# Patient Record
Sex: Male | Born: 1975
Health system: Southern US, Community
[De-identification: ages and names within clinical notes are randomized; demographics above are authoritative.]

## PROBLEM LIST (undated history)

## (undated) DIAGNOSIS — E119 Type 2 diabetes mellitus without complications: Secondary | ICD-10-CM

---

## 2016-04-30 ENCOUNTER — Ambulatory Visit: Payer: Self-pay

## 2017-07-13 ENCOUNTER — Ambulatory Visit: Payer: Commercial Managed Care - PPO | Admitting: Family Medicine

## 2017-07-13 DIAGNOSIS — Z0289 Encounter for other administrative examinations: Secondary | ICD-10-CM

## 2017-07-16 ENCOUNTER — Ambulatory Visit: Payer: Commercial Managed Care - PPO | Admitting: Family Medicine

## 2017-08-20 ENCOUNTER — Ambulatory Visit: Payer: Commercial Managed Care - PPO | Admitting: Family Medicine

## 2017-08-25 ENCOUNTER — Ambulatory Visit: Payer: Commercial Managed Care - PPO | Admitting: Family Medicine

## 2019-01-03 ENCOUNTER — Other Ambulatory Visit: Payer: Self-pay

## 2019-01-03 ENCOUNTER — Other Ambulatory Visit: Payer: Self-pay | Admitting: Family Medicine

## 2019-01-03 DIAGNOSIS — Z20822 Contact with and (suspected) exposure to covid-19: Secondary | ICD-10-CM

## 2019-01-04 LAB — NOVEL CORONAVIRUS, NAA: SARS-CoV-2, NAA: NOT DETECTED

## 2019-01-26 ENCOUNTER — Ambulatory Visit
Admission: EM | Admit: 2019-01-26 | Discharge: 2019-01-26 | Disposition: A | Discharge status: Home / Self Care | Payer: Commercial Managed Care - PPO | Attending: Physician Assistant | Admitting: Physician Assistant

## 2019-01-26 DIAGNOSIS — M545 Low back pain, unspecified: Secondary | ICD-10-CM

## 2019-01-26 HISTORY — DX: Type 2 diabetes mellitus without complications: E11.9

## 2019-01-26 MED ORDER — MELOXICAM 7.5 MG PO TABS: 7.5000 mg | ORAL_TABLET | Freq: Every day | ORAL | 0 refills | Status: DC

## 2019-01-26 MED ORDER — METHOCARBAMOL 500 MG PO TABS: 500.0000 mg | ORAL_TABLET | Freq: Two times a day (BID) | ORAL | 0 refills | Status: DC | PRN

## 2019-01-26 NOTE — Discharge Instructions (Signed)
Start Mobic. Do not take ibuprofen (motrin/advil)/ naproxen (aleve) while on mobic. Robaxin as needed, this can make you drowsy, so do not take if you are going to drive, operate heavy machinery, or make important decisions. Ice/heat compresses as needed. Follow up with PCP for further evaluation and management if symptoms continues. If experience numbness/tingling of the inner thighs, loss of bladder or bowel control, go to the emergency department for evaluation.

## 2019-01-26 NOTE — ED Triage Notes (Signed)
Pt c/o lower back pain radiating down both legs x2 days. Took OTC with no relief

## 2019-01-26 NOTE — ED Provider Notes (Signed)
[name]    CSN: [id number] Arrival date & time: 01/26/19  1458      History   Chief Complaint Chief Complaint  Patient presents with  . Back Pain    HPI Dakota Harrison is a 43 y.o. male.   43 year old male comes in for 2-day history of low back pain that radiates down bilateral legs.  Patient denies any obvious injury/trauma.  States work requires long hours of walking, heavy lifting, and occasional strenuous activity.  He has been working longer stretches due to short staffing.  He denies saddle anesthesia, loss of bladder or bowel control.  Denies leg giving out.  Has been taking ibuprofen without much relief.     Past Medical History:  Diagnosis Date  . Diabetes mellitus without complication (HCC)     There are no active problems to display for this patient.   History reviewed. No pertinent surgical history.     Home Medications    Prior to Admission medications   Medication Sig Start Date End Date Taking? Authorizing Provider  meloxicam (MOBIC) 7.5 MG tablet Take 1 tablet (7.5 mg total) by mouth daily. 01/26/19   Cathie Hoops, Yoona Ishii V, PA-C  methocarbamol (ROBAXIN) 500 MG tablet Take 1 tablet (500 mg total) by mouth 2 (two) times daily as needed for muscle spasms. 01/26/19   Belinda Fisher, PA-C    Family History No family history on file.  Social History Social History   Tobacco Use  . Smoking status: Never Smoker  . Smokeless tobacco: Never Used  Substance Use Topics  . Alcohol use: Not Currently  . Drug use: Not on file     Allergies   Patient has no known allergies.   Review of Systems Review of Systems  Reason unable to perform ROS: See HPI as above.     Physical Exam Triage Vital Signs ED Triage Vitals  Enc Vitals Group     BP 01/26/19 1504 (!) 142/83     Pulse Rate 01/26/19 1504 94     Resp 01/26/19 1504 16     Temp 01/26/19 1504 98 F (36.7 C)     Temp Source 01/26/19 1504 Oral     SpO2 01/26/19 1504 94 %     Weight --    Height --      Head Circumference --      Peak Flow --      Pain Score 01/26/19 1459 7     Pain Loc --      Pain Edu? --      Excl. in GC? --    No data found.  Updated Vital Signs BP (!) 142/83 (BP Location: Left Arm)   Pulse 94   Temp 98 F (36.7 C) (Oral)   Resp 16   SpO2 94%   Visual Acuity Right Eye Distance:   Left Eye Distance:   Bilateral Distance:    Right Eye Near:   Left Eye Near:    Bilateral Near:     Physical Exam Constitutional:      General: He is not in acute distress.    Appearance: He is well-developed. He is not diaphoretic.  HENT:     Head: Normocephalic and atraumatic.  Eyes:     Conjunctiva/sclera: Conjunctivae normal.     Pupils: Pupils are equal, round, and reactive to light.  Cardiovascular:     Rate and Rhythm: Normal rate and regular rhythm.     Heart sounds: Normal heart sounds. No murmur.  No friction rub. No gallop.   Pulmonary:     Effort: Pulmonary effort is normal. No accessory muscle usage or respiratory distress.     Breath sounds: Normal breath sounds. No stridor. No decreased breath sounds, wheezing, rhonchi or rales.  Musculoskeletal:     Comments: No tenderness on palpation of the spinous processes.  Tenderness to palpation of bilateral lumbar region.  Decreased flexion and rotation of back due to pain.  Full range of motion of hips. Sensation intact and equal bilaterally.  Negative straight leg raise.   Skin:    General: Skin is warm and dry.  Neurological:     Mental Status: He is alert and oriented to person, place, and time.     UC Treatments / Results  Labs (all labs ordered are listed, but only abnormal results are displayed) Labs Reviewed - No data to display  EKG   Radiology No results found.  Procedures Procedures (including critical care time)  Medications Ordered in UC Medications - No data to display  Initial Impression / Assessment and Plan / UC Course  I have reviewed the triage vital signs and  the nursing notes.  Pertinent labs & imaging results that were available during my care of the patient were reviewed by me and considered in my medical decision making (see chart for details).    Start NSAID as directed for pain and inflammation. Muscle relaxant as needed. Ice/heat compresses. Discussed with patient strain can take up to 3-4 weeks to resolve, but should be getting better each week. Return precautions given.   Final Clinical Impressions(s) / UC Diagnoses   Final diagnoses:  Acute bilateral low back pain without sciatica    ED Prescriptions    Medication Sig Dispense Auth. Provider   meloxicam (MOBIC) 7.5 MG tablet Take 1 tablet (7.5 mg total) by mouth daily. 14 tablet [name] V, PA-C   methocarbamol (ROBAXIN) 500 MG tablet Take 1 tablet (500 mg total) by mouth 2 (two) times daily as needed for muscle spasms. 20 tablet Ok Edwards, PA-C     PDMP not reviewed this encounter.   Ok Edwards, PA-C 01/26/19 1626

## 2019-02-20 ENCOUNTER — Encounter: Payer: Self-pay | Admitting: Emergency Medicine

## 2019-02-20 ENCOUNTER — Other Ambulatory Visit: Payer: Self-pay

## 2019-02-20 ENCOUNTER — Ambulatory Visit
Admission: EM | Admit: 2019-02-20 | Discharge: 2019-02-20 | Disposition: A | Discharge status: Home / Self Care | Payer: Commercial Managed Care - PPO | Attending: Physician Assistant | Admitting: Physician Assistant

## 2019-02-20 DIAGNOSIS — Z20828 Contact with and (suspected) exposure to other viral communicable diseases: Secondary | ICD-10-CM

## 2019-02-20 DIAGNOSIS — R05 Cough: Secondary | ICD-10-CM

## 2019-02-20 DIAGNOSIS — R059 Cough, unspecified: Secondary | ICD-10-CM

## 2019-02-20 MED ORDER — FLUTICASONE PROPIONATE 50 MCG/ACT NA SUSP: 2.0000 | Freq: Every day | NASAL | 0 refills | Status: DC

## 2019-02-20 MED ORDER — TIZANIDINE HCL 4 MG PO TABS: 4.0000 mg | ORAL_TABLET | Freq: Three times a day (TID) | ORAL | 0 refills | Status: DC | PRN

## 2019-02-20 MED ORDER — BENZONATATE 200 MG PO CAPS: 200.0000 mg | ORAL_CAPSULE | Freq: Three times a day (TID) | ORAL | 0 refills | Status: DC

## 2019-02-20 NOTE — Discharge Instructions (Addendum)
As discussed, cannot rule out COVID. Currently, no alarming signs. Testing ordered. I would like you to quarantine until testing results. Start tessalon for cough. Flonase for nasal congestion. If experiencing shortness of breath, trouble breathing, go to the emergency department for further evaluation needed.   Stop robaxin, try tizanidine for back pain. Follow up with sports medicine/orthopedics for further evaluation needed.

## 2019-02-20 NOTE — ED Triage Notes (Signed)
Pt presents to [facility] for 2 days of cough and nasal congestion.,  Denies fevers or chills at home.  Denies n/v/d.

## 2019-02-20 NOTE — ED Provider Notes (Signed)
[name]    CSN: [id number] Arrival date & time: 02/20/19  1110      History   Chief Complaint Chief Complaint  Patient presents with  . Cough    HPI Dakota Harrison is a 43 y.o. male.   43 year old male comes in for 2-day history of URI symptoms.  He has had nonproductive cough, nasal congestion.  Denies rhinorrhea, sore throat.  Denies fever, chills, body aches.  Denies shortness of breath.  He does feel that he is has decreased taste.  Denies abdominal pain, nausea, vomiting, diarrhea.  Denies known exposure.  Never smoker.     Past Medical History:  Diagnosis Date  . Diabetes mellitus without complication (HCC)     There are no active problems to display for this patient.   History reviewed. No pertinent surgical history.     Home Medications    Prior to Admission medications   Medication Sig Start Date End Date Taking? Authorizing Provider  benzonatate (TESSALON) 200 MG capsule Take 1 capsule (200 mg total) by mouth every 8 (eight) hours. 02/20/19   Cathie Hoops, Amy V, PA-C  fluticasone (FLONASE) 50 MCG/ACT nasal spray Place 2 sprays into both nostrils daily. 02/20/19   Cathie Hoops, Amy V, PA-C  methocarbamol (ROBAXIN) 500 MG tablet Take 1 tablet (500 mg total) by mouth 2 (two) times daily as needed for muscle spasms. 01/26/19   Cathie Hoops, Amy V, PA-C  tiZANidine (ZANAFLEX) 4 MG tablet Take 1 tablet (4 mg total) by mouth every 8 (eight) hours as needed for muscle spasms. 02/20/19   Belinda Fisher, PA-C    Family History History reviewed. No pertinent family history.  Social History Social History   Tobacco Use  . Smoking status: Never Smoker  . Smokeless tobacco: Never Used  Substance Use Topics  . Alcohol use: Not Currently  . Drug use: Not on file     Allergies   Patient has no known allergies.   Review of Systems Review of Systems  Reason unable to perform ROS: See HPI as above.     Physical Exam Triage Vital Signs ED Triage Vitals  Enc Vitals Group   BP 02/20/19 1123 139/86     Pulse Rate 02/20/19 1123 70     Resp 02/20/19 1123 18     Temp 02/20/19 1123 98.7 F (37.1 C)     Temp Source 02/20/19 1123 Temporal     SpO2 02/20/19 1123 97 %     Weight --      Height --      Head Circumference --      Peak Flow --      Pain Score 02/20/19 1124 0     Pain Loc --      Pain Edu? --      Excl. in GC? --    No data found.  Updated Vital Signs BP 139/86 (BP Location: Left Arm)   Pulse 70   Temp 98.7 F (37.1 C) (Temporal)   Resp 18   SpO2 97%   Physical Exam Constitutional:      General: He is not in acute distress.    Appearance: Normal appearance. He is not ill-appearing, toxic-appearing or diaphoretic.  HENT:     Head: Normocephalic and atraumatic.     Mouth/Throat:     Mouth: Mucous membranes are moist.     Pharynx: Oropharynx is clear. Uvula midline.  Neck:     Musculoskeletal: Normal range of motion and neck supple.  Cardiovascular:  Rate and Rhythm: Normal rate and regular rhythm.     Heart sounds: Normal heart sounds. No murmur. No friction rub. No gallop.   Pulmonary:     Effort: Pulmonary effort is normal. No accessory muscle usage, prolonged expiration, respiratory distress or retractions.     Comments: Lungs clear to auscultation without adventitious lung sounds. Skin:    General: Skin is warm and dry.  Neurological:     General: No focal deficit present.     Mental Status: He is alert and oriented to person, place, and time.      UC Treatments / Results  Labs (all labs ordered are listed, but only abnormal results are displayed) Labs Reviewed  NOVEL CORONAVIRUS, NAA    EKG   Radiology No results found.  Procedures Procedures (including critical care time)  Medications Ordered in UC Medications - No data to display  Initial Impression / Assessment and Plan / UC Course  I have reviewed the triage vital signs and the nursing notes.  Pertinent labs & imaging results that were available  during my care of the patient were reviewed by me and considered in my medical decision making (see chart for details).    No alarming signs on exam.  Patient speaking in full sentences without respiratory distress.  COVID testing ordered.  Patient to quarantine until testing results return.  Symptomatic treatment discussed.  Push fluids.  Return precautions given.  Patient expresses understanding and agrees to plan.  Patient was seen 01/26/2019 for back pain.  States medications has helped, but has not completely resolved pain.  He has been working to get in with sports medicine, but has long wait.  States Robaxin makes him drowsy, and therefore only takes at night, but does help symptoms.  At this time, will have patient discontinue Robaxin and try tizanidine.  Will provide other resources for orthopedics for further evaluation and management needed.  Final Clinical Impressions(s) / UC Diagnoses   Final diagnoses:  Cough   ED Prescriptions    Medication Sig Dispense Auth. Provider   benzonatate (TESSALON) 200 MG capsule Take 1 capsule (200 mg total) by mouth every 8 (eight) hours. 21 capsule Yu, Amy V, PA-C   tiZANidine (ZANAFLEX) 4 MG tablet Take 1 tablet (4 mg total) by mouth every 8 (eight) hours as needed for muscle spasms. 30 tablet Yu, Amy V, PA-C   fluticasone (FLONASE) 50 MCG/ACT nasal spray Place 2 sprays into both nostrils daily. 1 g Ok Edwards, PA-C     PDMP not reviewed this encounter.   Ok Edwards, PA-C 02/20/19 1233

## 2019-02-21 ENCOUNTER — Telehealth: Payer: Self-pay | Admitting: Emergency Medicine

## 2019-02-21 NOTE — Telephone Encounter (Signed)
Left voicemail checking in on patient, and encouraged return call with any continuing questions or concerns.    

## 2019-02-22 LAB — NOVEL CORONAVIRUS, NAA: SARS-CoV-2, NAA: NOT DETECTED

## 2019-02-23 ENCOUNTER — Telehealth: Payer: Self-pay | Admitting: General Practice

## 2019-02-23 NOTE — Telephone Encounter (Signed)
Pt called to get COVID results, made him aware they are negative. 

## 2019-02-25 ENCOUNTER — Ambulatory Visit (INDEPENDENT_AMBULATORY_CARE_PROVIDER_SITE_OTHER): Payer: Commercial Managed Care - PPO

## 2019-02-25 ENCOUNTER — Other Ambulatory Visit (HOSPITAL_COMMUNITY)
Admission: RE | Admit: 2019-02-25 | Discharge: 2019-02-25 | Disposition: A | Discharge status: Home / Self Care | Payer: Commercial Managed Care - PPO | Source: Ambulatory Visit | Attending: Family Medicine | Admitting: Family Medicine

## 2019-02-25 ENCOUNTER — Other Ambulatory Visit: Payer: Self-pay

## 2019-02-25 ENCOUNTER — Encounter: Payer: Self-pay | Admitting: Family Medicine

## 2019-02-25 ENCOUNTER — Ambulatory Visit (INDEPENDENT_AMBULATORY_CARE_PROVIDER_SITE_OTHER): Payer: Commercial Managed Care - PPO | Admitting: Family Medicine

## 2019-02-25 VITALS — BP 120/70 | HR 78 | Ht 72.0 in | Wt 242.0 lb

## 2019-02-25 DIAGNOSIS — S39012A Strain of muscle, fascia and tendon of lower back, initial encounter: Secondary | ICD-10-CM

## 2019-02-25 DIAGNOSIS — R351 Nocturia: Secondary | ICD-10-CM

## 2019-02-25 DIAGNOSIS — R350 Frequency of micturition: Secondary | ICD-10-CM

## 2019-02-25 LAB — URINALYSIS, ROUTINE W REFLEX MICROSCOPIC
Bilirubin Urine: NEGATIVE
Hgb urine dipstick: NEGATIVE
Ketones, ur: NEGATIVE
Leukocytes,Ua: NEGATIVE
Nitrite: NEGATIVE
RBC / HPF: NONE SEEN (ref 0–?)
Specific Gravity, Urine: 1.01 (ref 1.000–1.030)
Total Protein, Urine: NEGATIVE
Urine Glucose: >=1000 — AB
Urobilinogen, UA: 0.2 (ref 0.0–1.0)
pH: 7.5 (ref 5.0–8.0)

## 2019-02-25 MED ORDER — MELOXICAM 15 MG PO TABS: 15.0000 mg | ORAL_TABLET | Freq: Every day | ORAL | 0 refills | Status: DC

## 2019-02-25 NOTE — Patient Instructions (Signed)
Back Injury Prevention Back injuries can be very painful. They can also be difficult to heal. After having one back injury, you are more likely to have another one again. It is important to learn how to avoid injuring or re-injuring your back. The following tips can help you to prevent a back injury. What actions can I take to prevent back injuries? Changes in your diet Talk with your doctor about what to eat. Some foods can make the bones strong.  Talk with your doctor about how much calcium and vitamin D you need each day. These nutrients help to prevent weakening of the bones (osteoporosis).  Eat foods that have calcium. These include: ? Dairy products. ? Green leafy vegetables. ? Food and drinks that have had calcium added to them (fortified).  Eat foods that have vitamin D. These include: ? Milk. ? Food and drinks that have had vitamin D added to them.  Take other supplements and vitamins only as told by your doctor. Physical fitness Physical fitness makes your bones and muscles strong. It also improves your balance and strength.  Exercise for 30 minutes per day on most days of the week, or as told by your doctor. Make sure to: ? Do aerobic exercises, such as walking, jogging, biking, or swimming. ? Do exercises that increase balance and strength, such as tai chi and yoga. ? Do stretching exercises. This helps with flexibility. ? Develop strong belly (abdominal) muscles. Your belly muscles help to support your back.  Stay at a healthy weight. This lowers your risk of a back injury. Good posture        Prevent back injuries by developing and maintaining a good posture. To do this:  Sit up straight and stand up straight. Avoid leaning forward when you sit or hunching over when you stand.  Choose chairs that have good low-back (lumbar) support.  If you work at a desk: ? Sit close to it so you do not need to lean over. ? Keep your chin tucked in. ? Keep your neck drawn  back. ? Keep your elbows bent so that your arms make a corner (right angle).  When you drive: ? Sit high and close to the steering wheel. Add a low-back support to your car seat, if needed. ? Take breaks every hour if you are driving for long periods of time.  Avoid sitting or standing in one position for very long. Take breaks to get up, stretch, and walk around at least once every hour.  Sleep on your side with your knees slightly bent, or sleep on your back with a pillow under your knees.  Lifting, twisting, and reaching   Heavy lifting ? Avoid heavy lifting, especially lifting over and over again. If you must do heavy lifting: ? Stretch before lifting. ? Work slowly. ? Rest between lifts. ? Use a tool such as a cart or a dolly to move objects if one is available. ? Make several small trips instead of carrying one heavy load. ? Ask for help when you need it, especially when moving big objects. ? Follow these steps when lifting: ? Stand with your feet shoulder-width apart. ? Get as close to the object as you can. Do not pick up a heavy object that is far from your body. ? Use handles or lifting straps if they are available. ? Bend at your knees. Squat down, but keep your heels off the floor. ? Keep your shoulders back. Keep your chin tucked in. Keep  your back straight. ? Lift the object slowly while you tighten the muscles in your legs, belly, and bottom. Keep the object as close to the center of your body as possible. ? Follow these steps when putting down a heavy load: ? Stand with your feet shoulder-width apart. ? Lower the object slowly while you tighten the muscles in your legs, belly, and bottom. Keep the object as close to the center of your body as possible. ? Keep your shoulders back. Keep your chin tucked in. Keep your back straight. ? Bend at your knees. Squat down, but keep your heels off the floor. ? Use handles or lifting straps if they are available.  Twisting and  reaching ? Avoid lifting heavy objects above your waist. ? Do not twist at your waist while you are lifting or carrying a load. If you need to turn, move your feet. ? Do not bend over without bending at your knees. ? Avoid reaching over your head, across a table, or for an object on a high surface. Other things to do   Avoid wet floors and icy ground. Keep sidewalks clear of ice to prevent falls.  Do not sleep on a mattress that is too soft or too hard.  Store heavier objects on shelves at waist level.  Store lighter objects on lower or higher shelves.  Find ways to lower your stress, such as: ? Exercise. ? Massage. ? Relaxation techniques.  Talk with your doctor if you feel anxious or depressed. These conditions can make back pain worse.  Wear flat heel shoes with cushioned soles.  Use both shoulder straps when carrying a backpack.  Do not use any products that contain nicotine or tobacco, such as cigarettes and e-cigarettes. If you need help quitting, ask your doctor. Summary  Back injuries can be very painful and difficult to heal.  You can keep your back healthy by making certain changes. These include eating foods that make bones strong, working on being physically fit, developing a good posture, and lifting heavy objects in a safe way. This information is not intended to replace advice given to you by your health care provider. Make sure you discuss any questions you have with your health care provider. Document Released: 09/24/2007 Document Revised: 05/29/2017 Document Reviewed: 05/29/2017 Elsevier Patient Education  2020 [employer].
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
## 2019-02-27 LAB — URINE CULTURE
MICRO NUMBER:: 1073326
Result:: NO GROWTH
SPECIMEN QUALITY:: ADEQUATE

## 2019-03-01 LAB — URINE CYTOLOGY ANCILLARY ONLY
Chlamydia: NEGATIVE
Comment: NEGATIVE
Comment: NEGATIVE
Comment: NORMAL
Neisseria Gonorrhea: NEGATIVE
Trichomonas: NEGATIVE

## 2019-03-18 ENCOUNTER — Encounter: Payer: Self-pay | Admitting: Emergency Medicine

## 2019-03-18 ENCOUNTER — Ambulatory Visit
Admission: EM | Admit: 2019-03-18 | Discharge: 2019-03-18 | Disposition: A | Discharge status: Home / Self Care | Payer: Commercial Managed Care - PPO

## 2019-03-18 ENCOUNTER — Other Ambulatory Visit: Payer: Self-pay

## 2019-03-18 DIAGNOSIS — Z20822 Contact with and (suspected) exposure to covid-19: Secondary | ICD-10-CM

## 2019-03-18 DIAGNOSIS — Z20828 Contact with and (suspected) exposure to other viral communicable diseases: Secondary | ICD-10-CM

## 2019-03-18 NOTE — ED Triage Notes (Signed)
Pt presents to [facility] for assessment after being exposed to a COVID positive person Wednesday at work.  When asked about symptoms patient states "I've got a cough, but I've had it for way longer than this".  This RN did not test at this time due to [facility] guidelines for exposure testing, but will have patient speak to the provider.

## 2019-03-18 NOTE — ED Notes (Signed)
Patient able to ambulate independently  

## 2019-03-18 NOTE — ED Provider Notes (Signed)
[name]    CSN: [id number] Arrival date & time: 03/18/19  9323      History   Chief Complaint Chief Complaint  Patient presents with  . COVID Exposure    HPI Dakota Harrison is a 43 y.o. male presenting for  Presenting for Covid testing: Exposure: coworker Date of exposure: Wednesday 11/25 Any fever, symptoms since exposure: no Patient has no history of dry, chronic cough for which he is followed by his PCP.   Past Medical History:  Diagnosis Date  . Diabetes mellitus without complication [facility])     Patient Active Problem List   Diagnosis Date Noted  . Nocturia 02/25/2019  . Urinary frequency 02/25/2019  . Strain of lumbar region 02/25/2019    History reviewed. No pertinent surgical history.     Home Medications    Prior to Admission medications   Medication Sig Start Date End Date Taking? Authorizing Provider  meloxicam (MOBIC) 15 MG tablet Take 1 tablet (15 mg total) by mouth daily. 02/25/19   Dakota Maw, MD  fluticasone [facility]) 50 MCG/ACT nasal spray Place 2 sprays into both nostrils daily. 02/20/19 03/18/19  Ok Edwards, PA-C    Family History Family History  Family history unknown: Yes    Social History Social History   Tobacco Use  . Smoking status: Never Smoker  . Smokeless tobacco: Never Used  Substance Use Topics  . Alcohol use: Not Currently  . Drug use: Never     Allergies   Patient has no known allergies.   Review of Systems Review of Systems  Constitutional: Negative for fatigue and fever.  Respiratory: Negative for cough and shortness of breath.   Cardiovascular: Negative for chest pain and palpitations.  Gastrointestinal: Negative for abdominal pain, diarrhea and vomiting.  Musculoskeletal: Negative for arthralgias and myalgias.  Skin: Negative for rash and wound.  Neurological: Negative for speech difficulty and headaches.  All other systems reviewed and are negative.    Physical Exam Triage  Vital Signs ED Triage Vitals  Enc Vitals Group     BP      Pulse      Resp      Temp      Temp src      SpO2      Weight      Height      Head Circumference      Peak Flow      Pain Score      Pain Loc      Pain Edu?      Excl. in [place]?    No data found.  Updated Vital Signs BP 120/78 (BP Location: Left Arm)   Pulse 92   Temp 98.4 F (36.9 C) (Temporal)   Resp 18   SpO2 95%   Visual Acuity Right Eye Distance:   Left Eye Distance:   Bilateral Distance:    Right Eye Near:   Left Eye Near:    Bilateral Near:     Physical Exam Constitutional:      General: He is not in acute distress. HENT:     Head: Normocephalic and atraumatic.  Eyes:     General: No scleral icterus.    Pupils: Pupils are equal, round, and reactive to light.  Cardiovascular:     Rate and Rhythm: Normal rate.  Pulmonary:     Effort: Pulmonary effort is normal. No respiratory distress.     Breath sounds: No wheezing.  Skin:    Coloration:  Skin is not jaundiced or pale.  Neurological:     Mental Status: He is alert and oriented to person, place, and time.      UC Treatments / Results  Labs (all labs ordered are listed, but only abnormal results are displayed) Labs Reviewed - No data to display  EKG   Radiology No results found.  Procedures Procedures (including critical care time)  Medications Ordered in UC Medications - No data to display  Initial Impression / Assessment and Plan / UC Course  I have reviewed the triage vital signs and the nursing notes.  Pertinent labs & imaging results that were available during my care of the patient were reviewed by me and considered in my medical decision making (see chart for details).     Patient does not meet CDC guidelines for Covid PCR testing at this time.  Provided work note stating when he would qualify: No sooner than Monday, 03/21/2019.  Return precautions discussed, patient verbalized understanding and is agreeable to plan.  Final Clinical Impressions(s) / UC Diagnoses   Final diagnoses:  Exposure to COVID-19 virus     Discharge Instructions     Based on exposure, you would qualify for testing on Monday, 11/30.    ED Prescriptions    None     PDMP not reviewed this encounter.   Hall-Potvin, [place], [place] 03/18/19 1032

## 2019-03-18 NOTE — Discharge Instructions (Addendum)
Based on exposure, you would qualify for testing on Monday, 11/30.

## 2019-03-21 ENCOUNTER — Ambulatory Visit
Admission: EM | Admit: 2019-03-21 | Discharge: 2019-03-21 | Disposition: A | Discharge status: Home / Self Care | Payer: Commercial Managed Care - PPO | Attending: Emergency Medicine | Admitting: Emergency Medicine

## 2019-03-21 ENCOUNTER — Other Ambulatory Visit: Payer: Self-pay

## 2019-03-21 ENCOUNTER — Encounter: Payer: Self-pay | Admitting: Emergency Medicine

## 2019-03-21 DIAGNOSIS — Z20828 Contact with and (suspected) exposure to other viral communicable diseases: Secondary | ICD-10-CM | POA: Diagnosis not present

## 2019-03-21 DIAGNOSIS — Z20822 Contact with and (suspected) exposure to covid-19: Secondary | ICD-10-CM

## 2019-03-21 NOTE — ED Triage Notes (Signed)
Pt presents to [facility] 5 days after his last known exposure to a coworker who tested positive for COVID-19.  Denies any symptoms except a cough that has been "going on for months".

## 2019-03-21 NOTE — ED Provider Notes (Signed)
[name]    CSN: [id number] Arrival date & time: 03/21/19  1127      History   Chief Complaint Chief Complaint  Patient presents with  . COVID Exposure    HPI Dakota Harrison is a 43 y.o. male with history of diabetes presenting for Covid testing due to exposure at work.  Work Librarian, academic.  Last exposure was 11/25.  Patient previously evaluated for this on 11/27, though due to being asymptomatic was not within CDC recommended timeframe for testing.  Patient still asymptomatic, afebrile: Has now been 5 days since exposure.  Past Medical History:  Diagnosis Date  . Diabetes mellitus without complication [facility])     Patient Active Problem List   Diagnosis Date Noted  . Nocturia 02/25/2019  . Urinary frequency 02/25/2019  . Strain of lumbar region 02/25/2019    History reviewed. No pertinent surgical history.     Home Medications    Prior to Admission medications   Medication Sig Start Date End Date Taking? Authorizing Provider  meloxicam (MOBIC) 15 MG tablet Take 1 tablet (15 mg total) by mouth daily. 02/25/19   Mliss Sax, MD  fluticasone [facility]) 50 MCG/ACT nasal spray Place 2 sprays into both nostrils daily. 02/20/19 03/18/19  Belinda Fisher, PA-C    Family History Family History  Problem Relation Age of Onset  . Healthy Mother   . Healthy Father     Social History Social History   Tobacco Use  . Smoking status: Never Smoker  . Smokeless tobacco: Never Used  Substance Use Topics  . Alcohol use: Not Currently  . Drug use: Never     Allergies   Patient has no known allergies.   Review of Systems Review of Systems  Constitutional: Negative for fatigue and fever.  Respiratory: Negative for cough and shortness of breath.   Cardiovascular: Negative for chest pain and palpitations.  Gastrointestinal: Negative for abdominal pain, diarrhea and vomiting.  Musculoskeletal: Negative for arthralgias and myalgias.  Skin: Negative for  rash and wound.  Neurological: Negative for speech difficulty and headaches.  All other systems reviewed and are negative.    Physical Exam Triage Vital Signs ED Triage Vitals [03/21/19 1134]  Enc Vitals Group     BP 132/80     Pulse Rate 79     Resp 18     Temp 97.9 F (36.6 C)     Temp Source Temporal     SpO2 94 %     Weight      Height      Head Circumference      Peak Flow      Pain Score 0     Pain Loc      Pain Edu?      Excl. in [place]?    No data found.  Updated Vital Signs BP 132/80 (BP Location: Right Arm)   Pulse 79   Temp 97.9 F (36.6 C) (Temporal)   Resp 18   SpO2 94%   Visual Acuity Right Eye Distance:   Left Eye Distance:   Bilateral Distance:    Right Eye Near:   Left Eye Near:    Bilateral Near:     Physical Exam Constitutional:      General: He is not in acute distress. HENT:     Head: Normocephalic and atraumatic.  Eyes:     General: No scleral icterus.    Pupils: Pupils are equal, round, and reactive to light.  Cardiovascular:  Rate and Rhythm: Normal rate.  Pulmonary:     Effort: Pulmonary effort is normal.  Skin:    Coloration: Skin is not jaundiced or pale.  Neurological:     Mental Status: He is alert and oriented to person, place, and time.      UC Treatments / Results  Labs (all labs ordered are listed, but only abnormal results are displayed) Labs Reviewed  NOVEL CORONAVIRUS, NAA    EKG   Radiology No results found.  Procedures Procedures (including critical care time)  Medications Ordered in UC Medications - No data to display  Initial Impression / Assessment and Plan / UC Course  I have reviewed the triage vital signs and the nursing notes.  Pertinent labs & imaging results that were available during my care of the patient were reviewed by me and considered in my medical decision making (see chart for details).     Covid PCR test pending: Patient to quarantine until results are back.  Return  precautions discussed, patient verbalized understanding and is agreeable to plan. Final Clinical Impressions(s) / UC Diagnoses   Final diagnoses:  Exposure to COVID-19 virus     Discharge Instructions     Your COVID test is pending - it is important to quarantine / isolate at home until your results are back. If you test positive and would like further evaluation for persistent or worsening symptoms, you may schedule an E-visit or virtual (video) visit throughout the [facility] app or website.  PLEASE NOTE: If you develop severe chest pain or shortness of breath please go to the ER or call [phone] for further evaluation --> DO NOT schedule electronic or virtual visits for this. Please call our office for further guidance / recommendations as needed.    ED Prescriptions    None     PDMP not reviewed this encounter.   Hall-Potvin, [place], [place] 03/21/19 1206

## 2019-03-21 NOTE — ED Notes (Signed)
Patient able to ambulate independently  

## 2019-03-21 NOTE — Discharge Instructions (Signed)
Your COVID test is pending - it is important to quarantine / isolate at home until your results are back. °If you test positive and would like further evaluation for persistent or worsening symptoms, you may schedule an E-visit or virtual (video) visit throughout the [facility] MyChart app or website. ° °PLEASE NOTE: If you develop severe chest pain or shortness of breath please go to the ER or call 9-1-1 for further evaluation --> DO NOT schedule electronic or virtual visits for this. °Please call our office for further guidance / recommendations as needed. °

## 2019-03-22 LAB — NOVEL CORONAVIRUS, NAA: SARS-CoV-2, NAA: NOT DETECTED

## 2019-04-27 ENCOUNTER — Other Ambulatory Visit: Payer: Commercial Managed Care - PPO

## 2019-04-27 ENCOUNTER — Other Ambulatory Visit: Payer: Self-pay

## 2019-04-27 ENCOUNTER — Encounter: Payer: Self-pay | Admitting: Emergency Medicine

## 2019-04-27 ENCOUNTER — Ambulatory Visit
Admission: EM | Admit: 2019-04-27 | Discharge: 2019-04-27 | Disposition: A | Discharge status: Home / Self Care | Payer: Commercial Managed Care - PPO | Attending: Emergency Medicine | Admitting: Emergency Medicine

## 2019-04-27 DIAGNOSIS — S39012A Strain of muscle, fascia and tendon of lower back, initial encounter: Secondary | ICD-10-CM

## 2019-04-27 DIAGNOSIS — R05 Cough: Secondary | ICD-10-CM

## 2019-04-27 DIAGNOSIS — Z20822 Contact with and (suspected) exposure to covid-19: Secondary | ICD-10-CM | POA: Diagnosis not present

## 2019-04-27 DIAGNOSIS — R519 Headache, unspecified: Secondary | ICD-10-CM | POA: Diagnosis not present

## 2019-04-27 DIAGNOSIS — R059 Cough, unspecified: Secondary | ICD-10-CM

## 2019-04-27 MED ORDER — MELOXICAM 15 MG PO TABS: 15.0000 mg | ORAL_TABLET | Freq: Every day | ORAL | 0 refills | Status: DC

## 2019-04-27 MED ORDER — KETOROLAC TROMETHAMINE 60 MG/2ML IM SOLN
60.0000 mg | Freq: Once | INTRAMUSCULAR | Status: AC
  Administered 2019-04-27: 14:00:00 60 mg via INTRAMUSCULAR

## 2019-04-27 MED ORDER — DEXAMETHASONE SODIUM PHOSPHATE 10 MG/ML IJ SOLN
10.0000 mg | Freq: Once | INTRAMUSCULAR | Status: AC
  Administered 2019-04-27: 14:00:00 10 mg via INTRAMUSCULAR

## 2019-04-27 MED ORDER — ONDANSETRON HCL 4 MG PO TABS: 4.0000 mg | ORAL_TABLET | Freq: Four times a day (QID) | ORAL | 0 refills | Status: DC

## 2019-04-27 MED ORDER — ONDANSETRON 4 MG PO TBDP
4.0000 mg | ORAL_TABLET | Freq: Once | ORAL | Status: AC
  Administered 2019-04-27: 4 mg via ORAL

## 2019-04-27 NOTE — Discharge Instructions (Signed)
Your COVID test is pending - it is important to quarantine / isolate at home until your results are back. °If you test positive and would like further evaluation for persistent or worsening symptoms, you may schedule an E-visit or virtual (video) visit throughout the [place] MyChart app or website. ° °PLEASE NOTE: If you develop severe chest pain or shortness of breath please go to the ER or call 9-1-1 for further evaluation --> DO NOT schedule electronic or virtual visits for this. °Please call our office for further guidance / recommendations as needed. °

## 2019-04-27 NOTE — ED Provider Notes (Signed)
[name]    CSN: [id number] Arrival date & time: 04/27/19  1205      History   Chief Complaint Chief Complaint  Patient presents with  . Headache  . Emesis    HPI Dakota Harrison is a 44 y.o. male with history of diabetes presenting for generalized headache since 1/2.  States it has been nearly constant, though alleviated by Tylenol.  Patient has had photophobia, nausea with 3 episodes of emesis.  No bile or blood.  Last emesis was last night.  Able to keep down food and water without nausea today.  Patient denies worst headache of life, thunderclap headache.  Patient states this feels like a migraine, which he endorses having previously.  Denies recent head trauma, change in vision, tinnitus, fever.   Past Medical History:  Diagnosis Date  . Diabetes mellitus without complication [facility])     Patient Active Problem List   Diagnosis Date Noted  . Nocturia 02/25/2019  . Urinary frequency 02/25/2019  . Strain of lumbar region 02/25/2019    History reviewed. No pertinent surgical history.     Home Medications    Prior to Admission medications   Medication Sig Start Date End Date Taking? Authorizing Provider  meloxicam (MOBIC) 15 MG tablet Take 1 tablet (15 mg total) by mouth daily. 04/27/19   Hall-Potvin, [place], PA-C  ondansetron (ZOFRAN) 4 MG tablet Take 1 tablet (4 mg total) by mouth every 6 (six) hours. 04/27/19   Hall-Potvin, [place], PA-C  fluticasone (FLONASE) 50 MCG/ACT nasal spray Place 2 sprays into both nostrils daily. 02/20/19 03/18/19  Belinda Fisher, PA-C    Family History Family History  Problem Relation Age of Onset  . Healthy Mother   . Healthy Father     Social History Social History   Tobacco Use  . Smoking status: Never Smoker  . Smokeless tobacco: Never Used  Substance Use Topics  . Alcohol use: Not Currently  . Drug use: Never     Allergies   Patient has no known allergies.   Review of Systems Review of Systems    Constitutional: Negative for activity change, appetite change, fatigue and fever.  HENT: Negative for congestion, sinus pressure and sinus pain.   Respiratory: Negative for cough, chest tightness, shortness of breath, wheezing and stridor.   Cardiovascular: Negative for chest pain and palpitations.  Gastrointestinal: Positive for nausea. Negative for abdominal pain, diarrhea and vomiting.       Intermittent  Musculoskeletal: Negative for arthralgias and myalgias.  Skin: Negative for rash and wound.  Neurological: Positive for headaches. Negative for dizziness, tremors, facial asymmetry, speech difficulty, weakness, light-headedness and numbness.  All other systems reviewed and are negative.    Physical Exam Triage Vital Signs ED Triage Vitals  Enc Vitals Group     BP      Pulse      Resp      Temp      Temp src      SpO2      Weight      Height      Head Circumference      Peak Flow      Pain Score      Pain Loc      Pain Edu?      Excl. in GC?    No data found.  Updated Vital Signs BP 116/79 (BP Location: Right Arm)   Pulse 95   Temp 98.8 F (37.1 C) (Temporal)   Resp 18  SpO2 94%   Visual Acuity Right Eye Distance:   Left Eye Distance:   Bilateral Distance:    Right Eye Near:   Left Eye Near:    Bilateral Near:     Physical Exam Constitutional:      General: He is not in acute distress.    Appearance: He is not ill-appearing or diaphoretic.  HENT:     Head: Normocephalic and atraumatic.     Right Ear: Tympanic membrane, ear canal and external ear normal.     Left Ear: Tympanic membrane, ear canal and external ear normal.     Mouth/Throat:     Mouth: Mucous membranes are moist.     Pharynx: Oropharynx is clear.  Eyes:     General: No scleral icterus.    Extraocular Movements: Extraocular movements intact.     Conjunctiva/sclera: Conjunctivae normal.     Pupils: Pupils are equal, round, and reactive to light.  Cardiovascular:     Rate and Rhythm:  Normal rate.  Pulmonary:     Effort: Pulmonary effort is normal. No respiratory distress.     Breath sounds: No wheezing.  Musculoskeletal:        General: No deformity. Normal range of motion.     Cervical back: Normal range of motion. No rigidity or tenderness.  Lymphadenopathy:     Cervical: No cervical adenopathy.  Skin:    Capillary Refill: Capillary refill takes less than 2 seconds.     Coloration: Skin is not jaundiced.     Findings: No bruising or rash.  Neurological:     Mental Status: He is alert.     Cranial Nerves: Cranial nerves are intact.     Sensory: Sensation is intact.     Motor: Motor function is intact.     Coordination: Coordination is intact.     Gait: Gait is intact.  Psychiatric:        Mood and Affect: Mood normal.        Behavior: Behavior normal.      UC Treatments / Results  Labs (all labs ordered are listed, but only abnormal results are displayed) Labs Reviewed  NOVEL CORONAVIRUS, NAA    EKG   Radiology No results found.  Procedures Procedures (including critical care time)  Medications Ordered in UC Medications  ketorolac (TORADOL) injection 60 mg (60 mg Intramuscular Given 04/27/19 1337)  dexamethasone (DECADRON) injection 10 mg (10 mg Intramuscular Given 04/27/19 1337)  ondansetron (ZOFRAN-ODT) disintegrating tablet 4 mg (4 mg Oral Given 04/27/19 1338)    Initial Impression / Assessment and Plan / UC Course  I have reviewed the triage vital signs and the nursing notes.  Pertinent labs & imaging results that were available during my care of the patient were reviewed by me and considered in my medical decision making (see chart for details).     Patient afebrile, nontoxic, and without neurocognitive deficit in office.  Given IM Toradol, Decadron, and Zofran ODT with improvement of symptoms upon time of discharge.  Will provide Mobic, Zofran as needed short-term, and have patient follow-up with PCP for further evaluation/management if  needed.  Encourage keeping symptom log in the interim.  Patient requesting Covid testing due to mild intermittent cough: PCR pending.  Return precautions discussed, patient verbalized understanding and is agreeable to plan. Final Clinical Impressions(s) / UC Diagnoses   Final diagnoses:  Cough  Nonintractable headache, unspecified chronicity pattern, unspecified headache type     Discharge Instructions     Your  COVID test is pending - it is important to quarantine / isolate at home until your results are back. If you test positive and would like further evaluation for persistent or worsening symptoms, you may schedule an E-visit or virtual (video) visit throughout the [facility] app or website.  PLEASE NOTE: If you develop severe chest pain or shortness of breath please go to the ER or call [phone] for further evaluation --> DO NOT schedule electronic or virtual visits for this. Please call our office for further guidance / recommendations as needed.    ED Prescriptions    Medication Sig Dispense Auth. Provider   meloxicam (MOBIC) 15 MG tablet Take 1 tablet (15 mg total) by mouth daily. 14 tablet Hall-Potvin, [place], PA-C   ondansetron (ZOFRAN) 4 MG tablet Take 1 tablet (4 mg total) by mouth every 6 (six) hours. 12 tablet Hall-Potvin, [place], PA-C     PDMP not reviewed this encounter.   Hall-Potvin, [place], [place] 04/28/19 1106

## 2019-04-27 NOTE — ED Triage Notes (Signed)
Pt presents to [facility] for assessment of emesis and headache since 1/2.  Patient denies nasal congestion or sore throat.  C/o mild cough.

## 2019-04-28 LAB — NOVEL CORONAVIRUS, NAA: SARS-CoV-2, NAA: NOT DETECTED

## 2019-05-05 ENCOUNTER — Other Ambulatory Visit: Payer: Self-pay

## 2019-05-06 ENCOUNTER — Encounter: Payer: Commercial Managed Care - PPO | Admitting: Family Medicine

## 2019-05-06 ENCOUNTER — Ambulatory Visit: Payer: Commercial Managed Care - PPO | Admitting: Family Medicine

## 2019-05-13 ENCOUNTER — Ambulatory Visit (INDEPENDENT_AMBULATORY_CARE_PROVIDER_SITE_OTHER): Payer: Commercial Managed Care - PPO | Admitting: Family Medicine

## 2019-05-13 ENCOUNTER — Other Ambulatory Visit: Payer: Self-pay

## 2019-05-13 VITALS — BP 132/76 | HR 91 | Temp 98.6°F | Ht 73.0 in | Wt 240.2 lb

## 2019-05-13 DIAGNOSIS — R350 Frequency of micturition: Secondary | ICD-10-CM

## 2019-05-13 DIAGNOSIS — G5601 Carpal tunnel syndrome, right upper limb: Secondary | ICD-10-CM | POA: Diagnosis not present

## 2019-05-13 DIAGNOSIS — E1165 Type 2 diabetes mellitus with hyperglycemia: Secondary | ICD-10-CM

## 2019-05-13 DIAGNOSIS — E7800 Pure hypercholesterolemia, unspecified: Secondary | ICD-10-CM | POA: Diagnosis not present

## 2019-05-13 LAB — TSH: TSH: 4.07 u[IU]/mL (ref 0.35–4.50)

## 2019-05-13 LAB — MICROALBUMIN / CREATININE URINE RATIO
Creatinine,U: 72.4 mg/dL
Microalb Creat Ratio: 1 mg/g (ref 0.0–30.0)
Microalb, Ur: <0.7 mg/dL (ref 0.0–1.9)

## 2019-05-13 LAB — URINALYSIS, ROUTINE W REFLEX MICROSCOPIC
Bilirubin Urine: NEGATIVE
Hgb urine dipstick: NEGATIVE
Ketones, ur: 15 — AB
Leukocytes,Ua: NEGATIVE
Nitrite: NEGATIVE
RBC / HPF: NONE SEEN (ref 0–?)
Specific Gravity, Urine: 1.02 (ref 1.000–1.030)
Total Protein, Urine: NEGATIVE
Urine Glucose: >=1000 — AB
Urobilinogen, UA: 0.2 (ref 0.0–1.0)
pH: 6 (ref 5.0–8.0)

## 2019-05-13 LAB — COMPREHENSIVE METABOLIC PANEL
ALT: 16 U/L (ref 0–53)
AST: 17 U/L (ref 0–37)
Albumin: 4.2 g/dL (ref 3.5–5.2)
Alkaline Phosphatase: 91 U/L (ref 39–117)
BUN: 13 mg/dL (ref 6–23)
CO2: 28 mEq/L (ref 19–32)
Calcium: 9.5 mg/dL (ref 8.4–10.5)
Chloride: 97 mEq/L (ref 96–112)
Creatinine, Ser: 0.81 mg/dL (ref 0.40–1.50)
GFR: 126.37 mL/min (ref >60.00)
Glucose, Bld: 330 mg/dL — ABNORMAL HIGH (ref 70–99)
Potassium: 4 mEq/L (ref 3.5–5.1)
Sodium: 134 mEq/L — ABNORMAL LOW (ref 135–145)
Total Bilirubin: 0.4 mg/dL (ref 0.2–1.2)
Total Protein: 6.9 g/dL (ref 6.0–8.3)

## 2019-05-13 LAB — CBC
HCT: 41.4 % (ref 39.0–52.0)
Hemoglobin: 13.5 g/dL (ref 13.0–17.0)
MCHC: 32.7 g/dL (ref 30.0–36.0)
MCV: 83.4 fl (ref 78.0–100.0)
Platelets: 318 10*3/uL (ref 150.0–400.0)
RBC: 4.96 Mil/uL (ref 4.22–5.81)
RDW: 14 % (ref 11.5–15.5)
WBC: 7.2 10*3/uL (ref 4.0–10.5)

## 2019-05-13 LAB — POCT URINALYSIS DIPSTICK
Bilirubin, UA: NEGATIVE
Blood, UA: NEGATIVE
Glucose, UA: POSITIVE — AB
Leukocytes, UA: NEGATIVE
Nitrite, UA: NEGATIVE
Protein, UA: NEGATIVE
Spec Grav, UA: 1.015 (ref 1.010–1.025)
Urobilinogen, UA: 1 E.U./dL
pH, UA: 6.5 (ref 5.0–8.0)

## 2019-05-13 LAB — HEMOGLOBIN A1C: Hgb A1c MFr Bld: 17.2 % — ABNORMAL HIGH (ref 4.6–6.5)

## 2019-05-13 LAB — LDL CHOLESTEROL, DIRECT: Direct LDL: 156 mg/dL

## 2019-05-13 MED ORDER — METFORMIN HCL ER 500 MG PO TB24: 500.0000 mg | ORAL_TABLET | Freq: Every day | ORAL | 0 refills | Status: DC

## 2019-05-13 NOTE — Patient Instructions (Signed)
Carpal Tunnel Syndrome  Carpal tunnel syndrome is a condition that causes pain in your hand and arm. The carpal tunnel is a narrow area that is on the palm side of your wrist. Repeated wrist motion or certain diseases may cause swelling in the tunnel. This swelling can pinch the main nerve in the wrist (median nerve). What are the causes? This condition may be caused by:  Repeated wrist motions.  Wrist injuries.  Arthritis.  A sac of fluid (cyst) or abnormal growth (tumor) in the carpal tunnel.  Fluid buildup during pregnancy. Sometimes the cause is not known. What increases the risk? The following factors may make you more likely to develop this condition:  Having a job in which you move your wrist in the same way many times. This includes jobs like being a butcher or a cashier.  Being a woman.  Having other health conditions, such as: ? Diabetes. ? Obesity. ? A thyroid gland that is not active enough (hypothyroidism). ? Kidney failure. What are the signs or symptoms? Symptoms of this condition include:  A tingling feeling in your fingers.  Tingling or a loss of feeling (numbness) in your hand.  Pain in your entire arm. This pain may get worse when you bend your wrist and elbow for a long time.  Pain in your wrist that goes up your arm to your shoulder.  Pain that goes down into your palm or fingers.  A weak feeling in your hands. You may find it hard to grab and hold items. You may feel worse at night. How is this diagnosed? This condition is diagnosed with a medical history and physical exam. You may also have tests, such as:  Electromyogram (EMG). This test checks the signals that the nerves send to the muscles.  Nerve conduction study. This test checks how well signals pass through your nerves.  Imaging tests, such as X-rays, ultrasound, and MRI. These tests check for what might be the cause of your condition. How is this treated? This condition may be treated  with:  Lifestyle changes. You will be asked to stop or change the activity that caused your problem.  Doing exercise and activities that make bones and muscles stronger (physical therapy).  Learning how to use your hand again (occupational therapy).  Medicines for pain and swelling (inflammation). You may have injections in your wrist.  A wrist splint.  Surgery. Follow these instructions at home: If you have a splint:  Wear the splint as told by your doctor. Remove it only as told by your doctor.  Loosen the splint if your fingers: ? Tingle. ? Lose feeling (become numb). ? Turn cold and blue.  Keep the splint clean.  If the splint is not waterproof: ? Do not let it get wet. ? Cover it with a watertight covering when you take a bath or a shower. Managing pain, stiffness, and swelling   If told, put ice on the painful area: ? If you have a removable splint, remove it as told by your doctor. ? Put ice in a plastic bag. ? Place a towel between your skin and the bag. ? Leave the ice on for 20 minutes, 2-3 times per day. General instructions  Take over-the-counter and prescription medicines only as told by your doctor.  Rest your wrist from any activity that may cause pain. If needed, talk with your boss at work about changes that can help your wrist heal.  Do any exercises as told by your doctor,   physical therapist, or occupational therapist.  Keep all follow-up visits as told by your doctor. This is important. Contact a doctor if:  You have new symptoms.  Medicine does not help your pain.  Your symptoms get worse. Get help right away if:  You have very bad numbness or tingling in your wrist or hand. Summary  Carpal tunnel syndrome is a condition that causes pain in your hand and arm.  It is often caused by repeated wrist motions.  Lifestyle changes and medicines are used to treat this problem. Surgery may help in very bad cases.  Follow your doctor's  instructions about wearing a splint, resting your wrist, keeping follow-up visits, and calling for help. This information is not intended to replace advice given to you by your health care provider. Make sure you discuss any questions you have with your health care provider. Document Revised: 08/14/2017 Document Reviewed: 08/14/2017 Elsevier Patient Education  2020 Elsevier Inc[employer]  

## 2019-05-13 NOTE — Progress Notes (Addendum)
Established Patient Office Visit  Subjective:  Patient ID: Dakota Harrison, male    DOB: Feb 27, 1976  Age: 44 y.o. MRN: [id number]  CC:  Chief Complaint  Patient presents with  . Urinary Frequency    c/o of urine frequency x2 week tingling in finger tips x 4 days    HPI [facility] presents for evaluation and treatment of increased urinary frequency.  Patient denies fevers chills, dysuria or discharge.  Denies decrease in the force of his stream pre or post void dribbling.  History of diabetes that has gone untreated for some time now.  He had been treated previously with Metformin and had tolerated the drug well.  For the last 3 to 4 days he has had tingling in the fingers of his right hand.  Right-hand-dominant.  He works in housekeeping.  Denies excessive computer work.  Mom was a diabetic.  Denies neck pain.  Past Medical History:  Diagnosis Date  . Diabetes mellitus without complication (HCC)     No past surgical history on file.  Family History  Problem Relation Age of Onset  . Healthy Mother   . Healthy Father     Social History   Socioeconomic History  . Marital status: Single    Spouse name: Not on file  . Number of children: Not on file  . Years of education: Not on file  . Highest education level: Not on file  Occupational History  . Not on file  Tobacco Use  . Smoking status: Never Smoker  . Smokeless tobacco: Never Used  Substance and Sexual Activity  . Alcohol use: Not Currently  . Drug use: Never  . Sexual activity: Not on file  Other Topics Concern  . Not on file  Social History Narrative  . Not on file   Social Determinants of Health   Financial Resource Strain:   . Difficulty of Paying Living Expenses: Not on file  Food Insecurity:   . Worried About Programme researcher, broadcasting/film/video in the Last Year: Not on file  . Ran Out of Food in the Last Year: Not on file  Transportation Needs:   . Lack of Transportation (Medical): Not on file  . Lack of Transportation  (Non-Medical): Not on file  Physical Activity:   . Days of Exercise per Week: Not on file  . Minutes of Exercise per Session: Not on file  Stress:   . Feeling of Stress : Not on file  Social Connections:   . Frequency of Communication with Friends and Family: Not on file  . Frequency of Social Gatherings with Friends and Family: Not on file  . Attends Religious Services: Not on file  . Active Member of Clubs or Organizations: Not on file  . Attends Banker Meetings: Not on file  . Marital Status: Not on file  Intimate Partner Violence:   . Fear of Current or Ex-Partner: Not on file  . Emotionally Abused: Not on file  . Physically Abused: Not on file  . Sexually Abused: Not on file    Outpatient Medications Prior to Visit  Medication Sig Dispense Refill  . meloxicam (MOBIC) 15 MG tablet Take 1 tablet (15 mg total) by mouth daily. (Patient not taking: Reported on 05/13/2019) 14 tablet 0  . ondansetron (ZOFRAN) 4 MG tablet Take 1 tablet (4 mg total) by mouth every 6 (six) hours. (Patient not taking: Reported on 05/13/2019) 12 tablet 0   No facility-administered medications prior to visit.  No Known Allergies  ROS Review of Systems  Constitutional: Negative for diaphoresis, fever and unexpected weight change.  HENT: Negative.   Eyes: Negative for photophobia and visual disturbance.  Respiratory: Negative.   Cardiovascular: Negative.   Gastrointestinal: Negative.   Endocrine: Positive for polyuria. Negative for polyphagia.  Genitourinary: Positive for frequency. Negative for difficulty urinating, discharge, dysuria and urgency.  Skin: Negative for pallor and rash.  Allergic/Immunologic: Negative for immunocompromised state.  Neurological: Negative for weakness and numbness.  Hematological: Does not bruise/bleed easily.  Psychiatric/Behavioral: Negative.       Objective:    Physical Exam  Constitutional: He is oriented to person, place, and time. He appears  well-developed. No distress.  HENT:  Head: Normocephalic and atraumatic.  Right Ear: External ear normal.  Left Ear: External ear normal.  Eyes: Conjunctivae are normal. Right eye exhibits no discharge. Left eye exhibits no discharge. No scleral icterus.  Neck: No JVD present. No tracheal deviation present.  Cardiovascular: Normal rate, regular rhythm and normal heart sounds.  Pulmonary/Chest: Effort normal and breath sounds normal. No stridor.  Musculoskeletal:     Right wrist: No swelling, deformity or tenderness. Normal range of motion.       Arms:     Cervical back: No tenderness or bony tenderness. Normal range of motion.  Neurological: He is alert and oriented to person, place, and time.  Skin: Skin is warm and dry. He is not diaphoretic.  Psychiatric: He has a normal mood and affect. His behavior is normal.    BP 132/76   Pulse 91   Temp 98.6 F (37 C) (Tympanic)   Ht 6\' 1"  (1.854 m)   Wt 240 lb 3.2 oz (109 kg)   SpO2 97%   BMI 31.69 kg/m  Wt Readings from Last 3 Encounters:  05/13/19 240 lb 3.2 oz (109 kg)  02/25/19 242 lb (109.8 kg)     Health Maintenance Due  Topic Date Due  . PNEUMOCOCCAL POLYSACCHARIDE VACCINE AGE 34-64 HIGH RISK  03/28/1979  . FOOT EXAM  03/28/1987  . OPHTHALMOLOGY EXAM  03/28/1987  . HIV Screening  03/27/1992  . TETANUS/TDAP  03/27/1996  . INFLUENZA VACCINE  11/20/2018    There are no preventive care reminders to display for this patient.  Lab Results  Component Value Date   TSH 4.07 05/13/2019   Lab Results  Component Value Date   WBC 6.6 05/15/2019   HGB 14.5 05/15/2019   HCT 43.4 05/15/2019   MCV 83.3 05/15/2019   PLT 330 05/15/2019   Lab Results  Component Value Date   NA 133 (L) 05/15/2019   K 4.0 05/15/2019   CO2 21 (L) 05/15/2019   GLUCOSE 284 (H) 05/15/2019   BUN 12 05/15/2019   CREATININE 0.81 05/15/2019   BILITOT 0.6 05/15/2019   ALKPHOS 76 05/15/2019   AST 17 05/15/2019   ALT 20 05/15/2019   PROT 6.9  05/15/2019   ALBUMIN 3.6 05/15/2019   CALCIUM 9.0 05/15/2019   ANIONGAP 11 05/15/2019   GFR 126.37 05/13/2019   No results found for: CHOL No results found for: HDL No results found for: LDLCALC No results found for: TRIG No results found for: CHOLHDL Lab Results  Component Value Date   HGBA1C 17.2 Repeated and verified X2. (H) 05/13/2019      Assessment & Plan:   Problem List Items Addressed This Visit      Endocrine   Type 2 diabetes mellitus with hyperglycemia, without long-term current use of insulin (  HCC)   Relevant Medications   glucose blood test strip   metFORMIN (GLUCOPHAGE XR) 500 MG 24 hr tablet   Other Relevant Orders   CBC (Completed)   Comprehensive metabolic panel (Completed)   Hemoglobin A1c (Completed)   LDL cholesterol, direct (Completed)   TSH (Completed)   Urinalysis, Routine w reflex microscopic (Completed)   Microalbumin / creatinine urine ratio (Completed)   Ambulatory referral to diabetic education     Nervous and Auditory   Carpal tunnel syndrome of right wrist     Other   Urinary frequency - Primary   Relevant Orders   POCT urinalysis dipstick (Completed)   Urinalysis, Routine w reflex microscopic (Completed)   Elevated LDL cholesterol level      Meds ordered this encounter  Medications  . DISCONTD: metFORMIN (GLUCOPHAGE-XR) 500 MG 24 hr tablet    Sig: Take 1 tablet (500 mg total) by mouth daily with breakfast.    Dispense:  90 tablet    Refill:  0    Patient needs cock up wrist splint please.  Marland Kitchen glucose blood test strip    Sig: Test twice daily.  Contour test strips.    Dispense:  100 each    Refill:  12  . metFORMIN (GLUCOPHAGE XR) 500 MG 24 hr tablet    Sig: Take one twice daily.    Dispense:  180 tablet    Refill:  1    Follow-up: Return in about 6 weeks (around 06/24/2019).   Patient wear a cock-up wrist splint when he sleeps.  Advised him that he could keep it on throughout the daytime.  He was given information on  carpal tunnel syndrome.  RTC in 1 month for recheck. Mliss Sax, MD

## 2019-05-15 ENCOUNTER — Encounter (HOSPITAL_COMMUNITY): Payer: Self-pay | Admitting: Emergency Medicine

## 2019-05-15 ENCOUNTER — Emergency Department (HOSPITAL_COMMUNITY): Payer: Commercial Managed Care - PPO

## 2019-05-15 ENCOUNTER — Other Ambulatory Visit: Payer: Self-pay

## 2019-05-15 ENCOUNTER — Emergency Department (HOSPITAL_COMMUNITY)
Admission: EM | Admit: 2019-05-15 | Discharge: 2019-05-15 | Disposition: A | Discharge status: Home / Self Care | Payer: Commercial Managed Care - PPO | Attending: Emergency Medicine | Admitting: Emergency Medicine

## 2019-05-15 DIAGNOSIS — E1165 Type 2 diabetes mellitus with hyperglycemia: Secondary | ICD-10-CM | POA: Diagnosis not present

## 2019-05-15 DIAGNOSIS — R739 Hyperglycemia, unspecified: Secondary | ICD-10-CM

## 2019-05-15 DIAGNOSIS — R197 Diarrhea, unspecified: Secondary | ICD-10-CM | POA: Insufficient documentation

## 2019-05-15 DIAGNOSIS — R109 Unspecified abdominal pain: Secondary | ICD-10-CM | POA: Diagnosis not present

## 2019-05-15 DIAGNOSIS — R350 Frequency of micturition: Secondary | ICD-10-CM | POA: Insufficient documentation

## 2019-05-15 DIAGNOSIS — Z7984 Long term (current) use of oral hypoglycemic drugs: Secondary | ICD-10-CM | POA: Diagnosis not present

## 2019-05-15 DIAGNOSIS — R631 Polydipsia: Secondary | ICD-10-CM | POA: Insufficient documentation

## 2019-05-15 LAB — URINALYSIS, ROUTINE W REFLEX MICROSCOPIC
Bacteria, UA: NONE SEEN
Bilirubin Urine: NEGATIVE
Glucose, UA: >=500 mg/dL — AB
Hgb urine dipstick: NEGATIVE
Ketones, ur: 80 mg/dL — AB
Leukocytes,Ua: NEGATIVE
Nitrite: NEGATIVE
Protein, ur: NEGATIVE mg/dL
Specific Gravity, Urine: 1.035 — ABNORMAL HIGH (ref 1.005–1.030)
pH: 5 (ref 5.0–8.0)

## 2019-05-15 LAB — COMPREHENSIVE METABOLIC PANEL
ALT: 20 U/L (ref 0–44)
AST: 17 U/L (ref 15–41)
Albumin: 3.6 g/dL (ref 3.5–5.0)
Alkaline Phosphatase: 76 U/L (ref 38–126)
Anion gap: 11 (ref 5–15)
BUN: 12 mg/dL (ref 6–20)
CO2: 21 mmol/L — ABNORMAL LOW (ref 22–32)
Calcium: 9 mg/dL (ref 8.9–10.3)
Chloride: 101 mmol/L (ref 98–111)
Creatinine, Ser: 0.81 mg/dL (ref 0.61–1.24)
GFR calc Af Amer: >60 mL/min (ref >60)
GFR calc non Af Amer: >60 mL/min (ref >60)
Glucose, Bld: 284 mg/dL — ABNORMAL HIGH (ref 70–99)
Potassium: 4 mmol/L (ref 3.5–5.1)
Sodium: 133 mmol/L — ABNORMAL LOW (ref 135–145)
Total Bilirubin: 0.6 mg/dL (ref 0.3–1.2)
Total Protein: 6.9 g/dL (ref 6.5–8.1)

## 2019-05-15 LAB — CBC
HCT: 43.4 % (ref 39.0–52.0)
Hemoglobin: 14.5 g/dL (ref 13.0–17.0)
MCH: 27.8 pg (ref 26.0–34.0)
MCHC: 33.4 g/dL (ref 30.0–36.0)
MCV: 83.3 fL (ref 80.0–100.0)
Platelets: 330 10*3/uL (ref 150–400)
RBC: 5.21 MIL/uL (ref 4.22–5.81)
RDW: 13.2 % (ref 11.5–15.5)
WBC: 6.6 10*3/uL (ref 4.0–10.5)
nRBC: 0 % (ref 0.0–0.2)

## 2019-05-15 LAB — CBG MONITORING, ED: Glucose-Capillary: 247 mg/dL — ABNORMAL HIGH (ref 70–99)

## 2019-05-15 LAB — LIPASE, BLOOD: Lipase: 31 U/L (ref 11–51)

## 2019-05-15 NOTE — ED Notes (Signed)
Discharge instructions discussed with pt. Pt was able to verbalized understanding with no questions at this time. Pt to follow up with PCP

## 2019-05-15 NOTE — ED Triage Notes (Signed)
Pt states his blood sugar has been high for a few days and been taking his metformin as prescribed. Endorses abd pain starting today. Reports some diarrhea today.

## 2019-05-15 NOTE — ED Provider Notes (Signed)
[facility]ENC[facility] DEPARTMENT Provider Note   CSN: [id number] Arrival date & time: 05/15/19  1759     History Chief Complaint  Patient presents with  . Hyperglycemia  . Abdominal Pain    Dakota Harrison is a 44 y.o. male with a history of diabetes recently started on Metformin, presented to emergency department with abdominal pain, thirst and excessive urination.  He reports ongoing symptoms for about 2 days.  He says he had some cramping abdominal pain and a loose bowel movement.  He says he is also been very thirsty recently.  He says been urinating a lot frequently.  His primary care provider just started him on Metformin approximately 7 days ago, has been taking 500 mg of Metformin every morning.  He is not on insulin.  He has no prior history of DKA.  He reports a little bit of cough and congestion earlier this week.  He denies fevers or chills.  He denies dysuria or hematuria.  He denies any other medical issues.  He last ate breakfast earlier this morning around 9 or 10 AM.  He does report to me that he tends to drink very little water at home and is to drink sweet juices.  He states his diet has "not been good" and "I need to do better about eating."  NKDA  HPI     Past Medical History:  Diagnosis Date  . Diabetes mellitus without complication [facility])     Patient Active Problem List   Diagnosis Date Noted  . Carpal tunnel syndrome of right wrist 05/13/2019  . Type 2 diabetes mellitus with hyperglycemia, without long-term current use of insulin ([place]) 05/13/2019  . Nocturia 02/25/2019  . Urinary frequency 02/25/2019  . Strain of lumbar region 02/25/2019    History reviewed. No pertinent surgical history.     Family History  Problem Relation Age of Onset  . Healthy Mother   . Healthy Father     Social History   Tobacco Use  . Smoking status: Never Smoker  . Smokeless tobacco: Never Used  Substance Use Topics  . Alcohol use: Not Currently  .  Drug use: Never    Home Medications Prior to Admission medications   Medication Sig Start Date End Date Taking? Authorizing Provider  meloxicam (MOBIC) 15 MG tablet Take 1 tablet (15 mg total) by mouth daily. Patient not taking: Reported on 05/13/2019 04/27/19   Hall-Potvin, [place], PA-C  metFORMIN (GLUCOPHAGE-XR) 500 MG 24 hr tablet Take 1 tablet (500 mg total) by mouth daily with breakfast. 05/13/19   Libby Maw, MD  ondansetron (ZOFRAN) 4 MG tablet Take 1 tablet (4 mg total) by mouth every 6 (six) hours. Patient not taking: Reported on 05/13/2019 04/27/19   Hall-Potvin, [place], PA-C  fluticasone (FLONASE) 50 MCG/ACT nasal spray Place 2 sprays into both nostrils daily. 02/20/19 03/18/19  Ok Edwards, PA-C    Allergies    Patient has no known allergies.  Review of Systems   Review of Systems  Constitutional: Negative for chills and fever.  Eyes: Negative for photophobia and visual disturbance.  Respiratory: Positive for cough and shortness of breath.   Cardiovascular: Negative for chest pain and palpitations.  Gastrointestinal: Positive for abdominal pain and diarrhea. Negative for constipation, nausea and vomiting.  Skin: Negative for pallor and rash.  Neurological: Negative for syncope and light-headedness.  All other systems reviewed and are negative.   Physical Exam Updated Vital Signs BP 121/84   Pulse (!) 117  Temp 98.3 F (36.8 C) (Oral)   Resp 19   SpO2 100%   Physical Exam Vitals and nursing note reviewed.  Constitutional:      Appearance: He is well-developed.  HENT:     Head: Normocephalic and atraumatic.  Eyes:     Conjunctiva/sclera: Conjunctivae normal.  Cardiovascular:     Rate and Rhythm: Normal rate and regular rhythm.     Heart sounds: No murmur.  Pulmonary:     Effort: Pulmonary effort is normal. No respiratory distress.     Breath sounds: Normal breath sounds.  Abdominal:     Palpations: Abdomen is soft.     Tenderness: There is no  abdominal tenderness. There is no guarding or rebound. Negative signs include Murphy's sign, Rovsing's sign and McBurney's sign.  Musculoskeletal:     Cervical back: Neck supple.  Skin:    General: Skin is warm and dry.  Neurological:     General: No focal deficit present.     Mental Status: He is alert and oriented to person, place, and time.  Psychiatric:        Mood and Affect: Mood normal.        Behavior: Behavior normal.     ED Results / Procedures / Treatments   Labs (all labs ordered are listed, but only abnormal results are displayed) Labs Reviewed  COMPREHENSIVE METABOLIC PANEL - Abnormal; Notable for the following components:      Result Value   Sodium 133 (*)    CO2 21 (*)    Glucose, Bld 284 (*)    All other components within normal limits  URINALYSIS, ROUTINE W REFLEX MICROSCOPIC - Abnormal; Notable for the following components:   Specific Gravity, Urine 1.035 (*)    Glucose, UA >=500 (*)    Ketones, ur 80 (*)    All other components within normal limits  CBG MONITORING, ED - Abnormal; Notable for the following components:   Glucose-Capillary 247 (*)    All other components within normal limits  LIPASE, BLOOD  CBC    EKG None  Radiology DG Chest Portable 1 View  Result Date: 05/15/2019 CLINICAL DATA:  Evaluation for infection. EXAM: PORTABLE CHEST 1 VIEW COMPARISON:  No pertinent prior studies available for comparison. FINDINGS: Heart size within normal limits. There is no airspace consolidation within the lungs. No evidence of pleural effusion or pneumothorax. No acute bony abnormality. IMPRESSION: No evidence of acute cardiopulmonary abnormality. Electronically Signed   By: Jackey Loge DO   On: 05/15/2019 19:52    Procedures Procedures (including critical care time)  Medications Ordered in ED Medications - No data to display  ED Course  I have reviewed the triage vital signs and the nursing notes.  Pertinent labs & imaging results that were  available during my care of the patient were reviewed by me and considered in my medical decision making (see chart for details).  44 year old male with recent diagnosis of diabetes, recently started Metformin, presenting to the ED with abdominal pain and loose bowel movement, as well as excessive thirst and urination.  He is stable on arrival.  He is clinically quite well-appearing.  He has no focal tenderness abdominal exam to suggest acute appendicitis or intra-abdominal infection.  He was complaining about a little bit of congestion shortness of breath.  We will check a Covid test as well as a chest x-ray.  Also check a UA to rule out other possible sources of infection.  I suspect this may largely  be diet driven, as he reports he drinks very little water and has an unhealthy diet.  We did discuss this.  He was able to tolerate p.o. and has no nausea, therefore I encouraged him to drink plenty of fluid and I am giving him water to drink at bedside.    No anion gap or significant electrolyte derangement If UA negative anticipate discharge home    Final Clinical Impression(s) / ED Diagnoses Final diagnoses:  Abdominal pain, unspecified abdominal location  Hyperglycemia    Rx / DC Orders ED Discharge Orders    None       Terald Sleeper, MD 05/15/19 2051

## 2019-05-15 NOTE — Discharge Instructions (Addendum)
Please drink LOTS of water for the next week - enough that your urine looks clear.  You should always keep your body hydrated with water.  Call your doctor's office tomorrow and discuss your ER visit.  Continue taking your metformin.  Be careful with your diet - I included information about a healthy diabetes diet.

## 2019-05-16 ENCOUNTER — Telehealth: Payer: Self-pay | Admitting: Family Medicine

## 2019-05-16 NOTE — Telephone Encounter (Signed)
Pt called and said because he is on metformin and watching his sugar he bought a test strip reader and was going to buy test strips as well but they are expensive and wanted to see if Dr Doreene Burke would send in a prescription for them. Contour next is the ones he needs. If [facility] can send [facility] to [facility] on [address]

## 2019-05-17 ENCOUNTER — Telehealth: Payer: Self-pay | Admitting: Family Medicine

## 2019-05-17 ENCOUNTER — Encounter: Payer: Self-pay | Admitting: Family Medicine

## 2019-05-17 DIAGNOSIS — E7800 Pure hypercholesterolemia, unspecified: Secondary | ICD-10-CM | POA: Insufficient documentation

## 2019-05-17 MED ORDER — GLUCOSE BLOOD VI STRP: ORAL_STRIP | 12 refills | Status: AC

## 2019-05-17 MED ORDER — METFORMIN HCL ER 500 MG PO TB24: ORAL_TABLET | ORAL | 1 refills | Status: DC

## 2019-05-17 NOTE — Telephone Encounter (Signed)
Rx faxed pt aware and will pick up today.

## 2019-05-17 NOTE — Telephone Encounter (Signed)
Patient is calling and requesting a rx for Contour Net Strips be sent to [facility] on [address].

## 2019-05-17 NOTE — Addendum Note (Signed)
Addended by: Andrez Grime on: 05/17/2019 08:35 AM   Modules accepted: Orders

## 2019-06-27 ENCOUNTER — Telehealth: Payer: Self-pay | Admitting: Family Medicine

## 2019-06-27 ENCOUNTER — Other Ambulatory Visit: Payer: Self-pay

## 2019-06-27 DIAGNOSIS — E1165 Type 2 diabetes mellitus with hyperglycemia: Secondary | ICD-10-CM

## 2019-06-27 MED ORDER — METFORMIN HCL ER 500 MG PO TB24: ORAL_TABLET | ORAL | 1 refills | Status: DC

## 2019-06-27 NOTE — Telephone Encounter (Signed)
Patient is calling and requesting a refill for Metformin sent to [facility] on [address]. CB is [phone]

## 2019-07-07 ENCOUNTER — Ambulatory Visit: Payer: Commercial Managed Care - PPO | Admitting: Family Medicine

## 2019-07-07 DIAGNOSIS — Z0289 Encounter for other administrative examinations: Secondary | ICD-10-CM

## 2019-07-14 ENCOUNTER — Ambulatory Visit: Payer: Commercial Managed Care - PPO | Admitting: Registered"

## 2019-07-25 ENCOUNTER — Ambulatory Visit: Payer: Commercial Managed Care - PPO | Admitting: Family Medicine

## 2019-07-26 ENCOUNTER — Encounter: Payer: Self-pay | Admitting: Emergency Medicine

## 2019-07-26 ENCOUNTER — Other Ambulatory Visit: Payer: Self-pay

## 2019-07-26 ENCOUNTER — Ambulatory Visit
Admission: EM | Admit: 2019-07-26 | Discharge: 2019-07-26 | Disposition: A | Discharge status: Home / Self Care | Payer: Commercial Managed Care - PPO | Attending: Emergency Medicine | Admitting: Emergency Medicine

## 2019-07-26 DIAGNOSIS — R112 Nausea with vomiting, unspecified: Secondary | ICD-10-CM | POA: Diagnosis not present

## 2019-07-26 DIAGNOSIS — E1165 Type 2 diabetes mellitus with hyperglycemia: Secondary | ICD-10-CM

## 2019-07-26 LAB — POCT FASTING CBG KUC MANUAL ENTRY: POCT Glucose (KUC): 387 mg/dL — AB (ref 70–99)

## 2019-07-26 MED ORDER — ONDANSETRON 4 MG PO TBDP
4.0000 mg | ORAL_TABLET | Freq: Once | ORAL | Status: AC
  Administered 2019-07-26: 4 mg via ORAL

## 2019-07-26 MED ORDER — ONDANSETRON HCL 4 MG PO TABS: 4.0000 mg | ORAL_TABLET | Freq: Four times a day (QID) | ORAL | 0 refills | Status: DC

## 2019-07-26 NOTE — ED Triage Notes (Signed)
Pt here for N/V x 3 days with last vomiting last night; pt sts he did eat today and has held down; pt sts needs covid test for work

## 2019-07-26 NOTE — ED Provider Notes (Signed)
[name]    CSN: [id number] Arrival date & time: 07/26/19  1120      History   Chief Complaint Chief Complaint  Patient presents with  . Nausea  . Emesis    HPI Dakota Harrison is a 44 y.o. male with history of type 2 diabetes presenting for 3-day course of nausea, vomiting.  Does admit to loose stools without blood or melena for the last day: Last BM was 1 year ago: No pain.  Denies projectile vomiting, biliary or bloody emesis.  Patient denies "feeling sick ".  No nasal congestion, sore throat, chest pain, palpitations, difficulty breathing, abdominal pain.  Patient denies alcohol use, history of pancreatitis.  Patient states symptoms have improved as of today, though his employer wants him to get Covid tested due to being out of work sick.  Patient states he was able to keep down a bacon egg and cheese biscuit without nausea or vomiting.  Patient states that he checks his sugars most days: Yesterday his sugar was reportedly 180 before lunch.  Patient denying polydipsia, polyuria, polyphasia, fever.    Past Medical History:  Diagnosis Date  . Diabetes mellitus without complication [facility])     Patient Active Problem List   Diagnosis Date Noted  . Elevated LDL cholesterol level 05/17/2019  . Carpal tunnel syndrome of right wrist 05/13/2019  . Type 2 diabetes mellitus with hyperglycemia, without long-term current use of insulin ([facility]) 05/13/2019  . Nocturia 02/25/2019  . Urinary frequency 02/25/2019  . Strain of lumbar region 02/25/2019    History reviewed. No pertinent surgical history.     Home Medications    Prior to Admission medications   Medication Sig Start Date End Date Taking? Authorizing Provider  glucose blood test strip Test twice daily.  Contour test strips. 05/17/19   Mliss Sax, MD  metFORMIN (GLUCOPHAGE XR) 500 MG 24 hr tablet Take one twice daily. 06/27/19   Mliss Sax, MD  ondansetron (ZOFRAN) 4 MG tablet Take 1 tablet (4  mg total) by mouth every 6 (six) hours. 07/26/19   Hall-Potvin, [place], PA-C  fluticasone (FLONASE) 50 MCG/ACT nasal spray Place 2 sprays into both nostrils daily. 02/20/19 03/18/19  Belinda Fisher, PA-C    Family History Family History  Problem Relation Age of Onset  . Healthy Mother   . Healthy Father     Social History Social History   Tobacco Use  . Smoking status: Never Smoker  . Smokeless tobacco: Never Used  Substance Use Topics  . Alcohol use: Not Currently  . Drug use: Never     Allergies   Patient has no known allergies.   Review of Systems As per HPI   Physical Exam Triage Vital Signs ED Triage Vitals  Enc Vitals Group     BP      Pulse      Resp      Temp      Temp src      SpO2      Weight      Height      Head Circumference      Peak Flow      Pain Score      Pain Loc      Pain Edu?      Excl. in GC?    No data found.  Updated Vital Signs BP 128/77 (BP Location: Left Arm)   Pulse 72   Temp 98 F (36.7 C) (Oral)   Resp 20  SpO2 92%   Visual Acuity Right Eye Distance:   Left Eye Distance:   Bilateral Distance:    Right Eye Near:   Left Eye Near:    Bilateral Near:     Physical Exam Constitutional:      General: He is not in acute distress. HENT:     Head: Normocephalic and atraumatic.  Eyes:     General: No scleral icterus.    Pupils: Pupils are equal, round, and reactive to light.  Cardiovascular:     Rate and Rhythm: Normal rate.  Pulmonary:     Effort: Pulmonary effort is normal. No respiratory distress.     Breath sounds: No wheezing.  Abdominal:     General: Bowel sounds are normal. There is no distension.     Palpations: Abdomen is soft. There is no mass.     Tenderness: There is no abdominal tenderness. There is no right CVA tenderness, left CVA tenderness, guarding or rebound.     Comments: Negative Murphy's, negative McBurney's, negative Rovsing's  Skin:    Coloration: Skin is not jaundiced or pale.   Neurological:     Mental Status: He is alert and oriented to person, place, and time.      UC Treatments / Results  Labs (all labs ordered are listed, but only abnormal results are displayed) Labs Reviewed  POCT FASTING CBG [facility] MANUAL ENTRY - Abnormal; Notable for the following components:      Result Value   POCT Glucose ([facility]) 387 (*)    All other components within normal limits  NOVEL CORONAVIRUS, NAA    EKG   Radiology No results found.  Procedures Procedures (including critical care time)  Medications Ordered in UC Medications  ondansetron (ZOFRAN-ODT) disintegrating tablet 4 mg (4 mg Oral Given 07/26/19 1200)    Initial Impression / Assessment and Plan / UC Course  I have reviewed the triage vital signs and the nursing notes.  Pertinent labs & imaging results that were available during my care of the patient were reviewed by me and considered in my medical decision making (see chart for details).     Patient afebrile, nontoxic in office today.  Patient given Zofran ODT which he tolerated well: Reporting improvement in nausea at time of discharge.  Covid PCR pending at request of patient's employer.  CBG 387.  Patient last ate this morning.  Per chart review hemoglobin A1c from 05/13/2019 was 17.2%.  Discussed that patient's baseline is likely higher than current and that fluctuating/uncontrolled sugars can be contributing to symptoms.  Provided in-person education as well as handout on nutrition and diabetes.  Patient has primary care, will call to follow-up regarding further management and keep sugar log in the interim.  ER return precautions discussed, patient verbalized understanding and is agreeable to plan. Final Clinical Impressions(s) / UC Diagnoses   Final diagnoses:  Uncontrolled type 2 diabetes mellitus with hyperglycemia (HCC)  Non-intractable vomiting with nausea, unspecified vomiting type     Discharge Instructions     Important to focus on oral  hydration: Sip water, diet ginger ale, and/or sugar free popsicles throughout the day. Please review handout on nutrition and diabetes. Important to take your Metformin every day as directed. Please schedule follow-up appointment with your PCP for further management of your diabetes as your sugars are very high Please go to the emergency room for persistent nausea, vomiting, headache, lightheadedness, severe abdominal pain, shortness of breath.    ED Prescriptions    Medication Sig  Dispense Auth. Provider   ondansetron (ZOFRAN) 4 MG tablet Take 1 tablet (4 mg total) by mouth every 6 (six) hours. 12 tablet Hall-Potvin, [place], PA-C     PDMP not reviewed this encounter.   Hall-Potvin, [place], [place] 07/26/19 1549

## 2019-07-26 NOTE — Discharge Instructions (Addendum)
Important to focus on oral hydration: Sip water, diet ginger ale, and/or sugar free popsicles throughout the day. Please review handout on nutrition and diabetes. Important to take your Metformin every day as directed. Please schedule follow-up appointment with your PCP for further management of your diabetes as your sugars are very high Please go to the emergency room for persistent nausea, vomiting, headache, lightheadedness, severe abdominal pain, shortness of breath.

## 2019-07-27 LAB — NOVEL CORONAVIRUS, NAA: SARS-CoV-2, NAA: NOT DETECTED

## 2019-07-27 LAB — SARS-COV-2, NAA 2 DAY TAT

## 2019-08-25 ENCOUNTER — Encounter: Payer: Commercial Managed Care - PPO | Attending: Family Medicine | Admitting: Registered"

## 2019-09-27 ENCOUNTER — Ambulatory Visit
Admission: EM | Admit: 2019-09-27 | Discharge: 2019-09-27 | Disposition: A | Discharge status: Home / Self Care | Payer: Commercial Managed Care - PPO | Attending: Physician Assistant | Admitting: Physician Assistant

## 2019-09-27 ENCOUNTER — Other Ambulatory Visit: Payer: Self-pay

## 2019-09-27 DIAGNOSIS — R1032 Left lower quadrant pain: Secondary | ICD-10-CM

## 2019-09-27 DIAGNOSIS — R1012 Left upper quadrant pain: Secondary | ICD-10-CM

## 2019-09-27 LAB — POCT URINALYSIS DIP (MANUAL ENTRY)
Bilirubin, UA: NEGATIVE
Blood, UA: NEGATIVE
Glucose, UA: 500 mg/dL — AB
Leukocytes, UA: NEGATIVE
Nitrite, UA: NEGATIVE
Protein Ur, POC: NEGATIVE mg/dL
Spec Grav, UA: 1.02 (ref 1.010–1.025)
Urobilinogen, UA: 0.2 E.U./dL
pH, UA: 6 (ref 5.0–8.0)

## 2019-09-27 LAB — POCT FASTING CBG KUC MANUAL ENTRY: POCT Glucose (KUC): 319 mg/dL — AB (ref 70–99)

## 2019-09-27 MED ORDER — METFORMIN HCL 1000 MG PO TABS: 1000.0000 mg | ORAL_TABLET | Freq: Two times a day (BID) | ORAL | 0 refills | Status: AC

## 2019-09-27 NOTE — ED Provider Notes (Signed)
[name]    CSN: [id number] Arrival date & time: 09/27/19  1844      History   Chief Complaint Chief Complaint  Patient presents with  . Abdominal Pain    HPI Dakota Harrison is a 44 y.o. male.   44 year old male with uncontrolled DM comes in for 1 day history of LUQ/LLQ pain. Pain was intermittent, now more constant, with pressure sensation. Worse with some movement. Denies nausea, vomiting, diarrhea. Denies worsening with oral intake. Denies fever, chills, body aches. Denies URI symptoms. Denies urinary changes.      Past Medical History:  Diagnosis Date  . Diabetes mellitus without complication [facility])     Patient Active Problem List   Diagnosis Date Noted  . Elevated LDL cholesterol level 05/17/2019  . Carpal tunnel syndrome of right wrist 05/13/2019  . Type 2 diabetes mellitus with hyperglycemia, without long-term current use of insulin ([facility]) 05/13/2019  . Nocturia 02/25/2019  . Urinary frequency 02/25/2019  . Strain of lumbar region 02/25/2019    History reviewed. No pertinent surgical history.     Home Medications    Prior to Admission medications   Medication Sig Start Date End Date Taking? Authorizing Provider  glucose blood test strip Test twice daily.  Contour test strips. 05/17/19   Mliss Sax, MD  metFORMIN (GLUCOPHAGE) 1000 MG tablet Take 1 tablet (1,000 mg total) by mouth 2 (two) times daily. 09/27/19 11/26/19  Cathie Hoops, Romello Hoehn V, PA-C  fluticasone (FLONASE) 50 MCG/ACT nasal spray Place 2 sprays into both nostrils daily. 02/20/19 03/18/19  Belinda Fisher, PA-C    Family History Family History  Problem Relation Age of Onset  . Healthy Mother   . Healthy Father     Social History Social History   Tobacco Use  . Smoking status: Never Smoker  . Smokeless tobacco: Never Used  Substance Use Topics  . Alcohol use: Not Currently  . Drug use: Never     Allergies   Patient has no known allergies.   Review of Systems Review of  Systems  Reason unable to perform ROS: See HPI as above.     Physical Exam Triage Vital Signs ED Triage Vitals  Enc Vitals Group     BP 09/27/19 1854 125/77     Pulse Rate 09/27/19 1854 84     Resp 09/27/19 1854 18     Temp 09/27/19 1854 98.1 F (36.7 C)     Temp Source 09/27/19 1854 Oral     SpO2 09/27/19 1854 96 %     Weight --      Height --      Head Circumference --      Peak Flow --      Pain Score 09/27/19 1856 7     Pain Loc --      Pain Edu? --      Excl. in GC? --    No data found.  Updated Vital Signs BP 125/77 (BP Location: Left Arm)   Pulse 84   Temp 98.1 F (36.7 C) (Oral)   Resp 18   SpO2 96%   Physical Exam Constitutional:      General: He is not in acute distress.    Appearance: Normal appearance. He is not ill-appearing, toxic-appearing or diaphoretic.  HENT:     Head: Normocephalic and atraumatic.  Cardiovascular:     Rate and Rhythm: Normal rate and regular rhythm.  Pulmonary:     Effort: Pulmonary effort is normal.  No respiratory distress.     Comments: LCTAB Abdominal:     General: Bowel sounds are normal.     Palpations: Abdomen is soft.     Tenderness: There is abdominal tenderness in the left upper quadrant and left lower quadrant. There is no guarding or rebound.  Musculoskeletal:     Cervical back: Normal range of motion and neck supple.  Skin:    General: Skin is warm and dry.  Neurological:     Mental Status: He is alert and oriented to person, place, and time.     UC Treatments / Results  Labs (all labs ordered are listed, but only abnormal results are displayed) Labs Reviewed  POCT FASTING CBG [facility] MANUAL ENTRY - Abnormal; Notable for the following components:      Result Value   POCT Glucose ([facility]) 319 (*)    All other components within normal limits  POCT URINALYSIS DIP (MANUAL ENTRY) - Abnormal; Notable for the following components:   Glucose, UA =500 (*)    Ketones, POC UA trace (5) (*)    All other components  within normal limits    EKG   Radiology No results found.  Procedures Procedures (including critical care time)  Medications Ordered in UC Medications - No data to display  Initial Impression / Assessment and Plan / UC Course  I have reviewed the triage vital signs and the nursing notes.  Pertinent labs & imaging results that were available during my care of the patient were reviewed by me and considered in my medical decision making (see chart for details).    No alarming signs on exam. Patient with trace ketones, but without n/v/d, weakness, urinary changes, lower suspicion for DKA at this time. Will treat symptomatically for now. Patient having barriers to reach PCP due to current family complications. Will increase metformin to 1000mg  BID for uncontrolled DM. Return precautions given. Patient expresses understanding and agrees to plan.  Final Clinical Impressions(s) / UC Diagnoses   Final diagnoses:  Acute LUQ pain  LLQ pain    ED Prescriptions    Medication Sig Dispense Auth. Provider   metFORMIN (GLUCOPHAGE) 1000 MG tablet Take 1 tablet (1,000 mg total) by mouth 2 (two) times daily. 120 tablet Ok Edwards, PA-C     PDMP not reviewed this encounter.   Ok Edwards, PA-C 09/27/19 2222

## 2019-09-27 NOTE — ED Triage Notes (Signed)
Pt c/o [facility] abdominal pain since this am. States pain is constant now. Denies n/v/d.

## 2019-09-27 NOTE — Discharge Instructions (Signed)
No alarming signs on exam. As discussed, may be from bowel habit changes. Keep hydrated, urine should be clear to pale yellow in color. Switch metformin to 1000mg  twice a day, you have a 60 day supply, please follow up with PCP as soon as you are able to for reevaluation and management needed. If having severe abdominal pain, nausea/vomiting, weakness, dizziness, go to the ED for further evaluation.

## 2020-04-17 IMAGING — DX DG CHEST 1V PORT
1 series · 1 of 1 positions shown · non-contrast
Comparison: No pertinent prior studies available for comparison.

CLINICAL DATA: Evaluation for infection.

EXAM:
PORTABLE CHEST 1 VIEW

[chest ap]
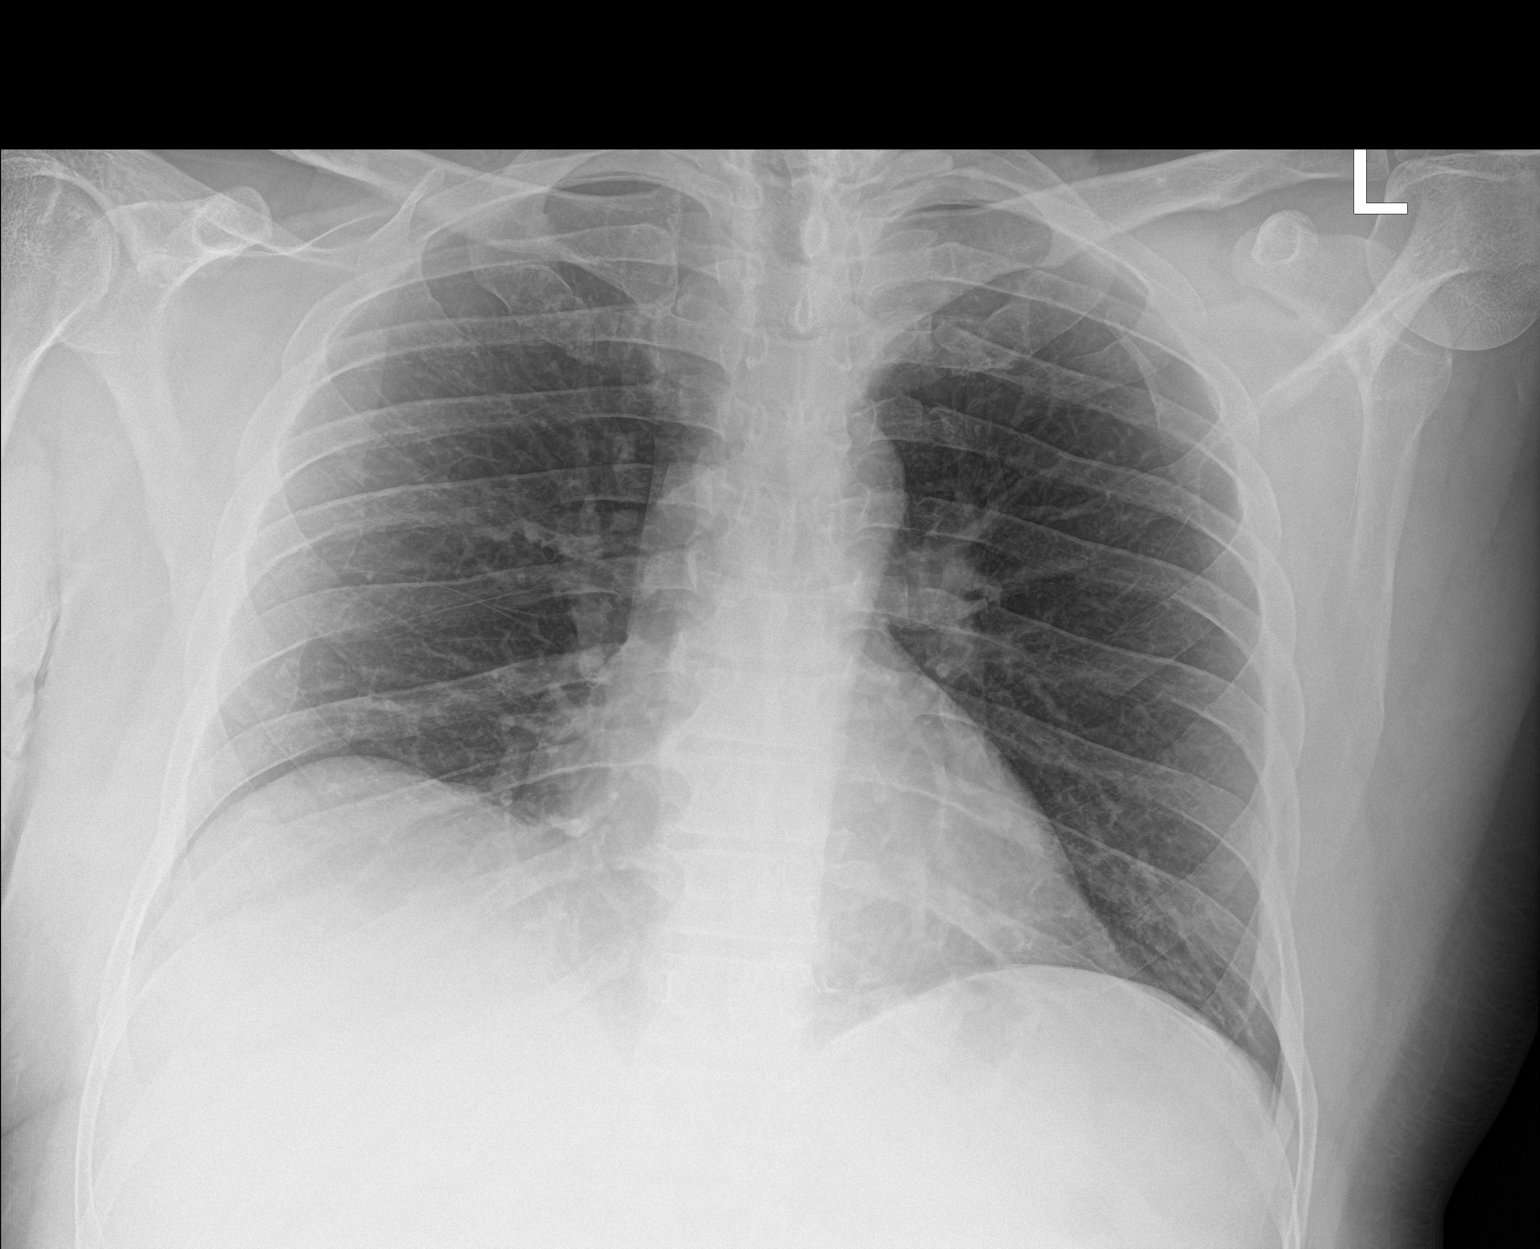

[1 of 1 positions shown; findings below may reference images not displayed]

FINDINGS: Heart size within normal limits.

There is no airspace consolidation within the lungs.

No evidence of pleural effusion or pneumothorax.

No acute bony abnormality.
IMPRESSION: No evidence of acute cardiopulmonary abnormality.

## 2022-06-04 NOTE — Progress Notes (Signed)
This encounter was created in error - please disregard.
